# Patient Record
Sex: Male | Born: 1950 | Race: White | Hispanic: No | Marital: Married | Smoking: Former smoker
Health system: Southern US, Community
[De-identification: ages and names within clinical notes are randomized; demographics above are authoritative.]

## PROBLEM LIST (undated history)

## (undated) DIAGNOSIS — R519 Headache, unspecified: Secondary | ICD-10-CM

## (undated) DIAGNOSIS — M1990 Unspecified osteoarthritis, unspecified site: Secondary | ICD-10-CM

## (undated) DIAGNOSIS — K759 Inflammatory liver disease, unspecified: Secondary | ICD-10-CM

## (undated) DIAGNOSIS — Z87442 Personal history of urinary calculi: Secondary | ICD-10-CM

## (undated) DIAGNOSIS — G4730 Sleep apnea, unspecified: Secondary | ICD-10-CM

## (undated) DIAGNOSIS — C801 Malignant (primary) neoplasm, unspecified: Secondary | ICD-10-CM

## (undated) DIAGNOSIS — K2270 Barrett's esophagus without dysplasia: Secondary | ICD-10-CM

## (undated) DIAGNOSIS — J189 Pneumonia, unspecified organism: Secondary | ICD-10-CM

## (undated) DIAGNOSIS — I739 Peripheral vascular disease, unspecified: Secondary | ICD-10-CM

## (undated) DIAGNOSIS — E119 Type 2 diabetes mellitus without complications: Secondary | ICD-10-CM

## (undated) DIAGNOSIS — G709 Myoneural disorder, unspecified: Secondary | ICD-10-CM

## (undated) DIAGNOSIS — I499 Cardiac arrhythmia, unspecified: Secondary | ICD-10-CM

## (undated) DIAGNOSIS — I639 Cerebral infarction, unspecified: Secondary | ICD-10-CM

## (undated) DIAGNOSIS — F4310 Post-traumatic stress disorder, unspecified: Secondary | ICD-10-CM

---

## 2000-07-16 ENCOUNTER — Ambulatory Visit (HOSPITAL_COMMUNITY): Admission: RE | Admit: 2000-07-16 | Discharge: 2000-07-16 | Payer: Self-pay | Admitting: *Deleted

## 2016-01-16 ENCOUNTER — Institutional Professional Consult (permissible substitution): Payer: Self-pay | Admitting: Pulmonary Disease

## 2020-10-23 ENCOUNTER — Ambulatory Visit (INDEPENDENT_AMBULATORY_CARE_PROVIDER_SITE_OTHER): Payer: No Typology Code available for payment source | Admitting: Emergency Medicine

## 2020-10-23 ENCOUNTER — Other Ambulatory Visit: Payer: Self-pay

## 2020-10-23 ENCOUNTER — Encounter: Payer: Self-pay | Admitting: Emergency Medicine

## 2020-10-23 ENCOUNTER — Telehealth: Payer: Self-pay | Admitting: Emergency Medicine

## 2020-10-23 VITALS — BP 126/76 | HR 73 | Temp 99.1°F | Ht 71.0 in | Wt 184.2 lb

## 2020-10-23 DIAGNOSIS — C3491 Malignant neoplasm of unspecified part of right bronchus or lung: Secondary | ICD-10-CM | POA: Diagnosis not present

## 2020-10-23 NOTE — Patient Instructions (Signed)
We will work on arranging bronchoscopy with navigation and endobronchial ultrasound.  We will hopefully be able to do this on 11/05/2020 at [facility].  We will finalize the details and contact you. Dr. Lamonte Sakai will obtain a copy of your CT scan of the chest and lab work from the [place] to review If we proceed with your bronchoscopy then you will need to stop her Eliquis 2 days prior.

## 2020-10-23 NOTE — Assessment & Plan Note (Signed)
Stage IIIb disease, received chemoradiation, temporarily was on consolidation immunotherapy but did not tolerate due to pneumonitis.  Has an enlarging groundglass pulmonary nodule and an enlarging subcarinal lymph node on serial/surveillance CT chest.  Suspect he will require bronchoscopy with EBUS, possibly also navigation.  I will obtain copies of his CT and labs from the [place].  Tentatively plan to perform his procedure on 11/05/2020 or 11/11/2020.  He will have to stop his Eliquis 2 days prior.

## 2020-10-23 NOTE — Progress Notes (Signed)
Subjective:    Patient ID: Neil Riddle, male    DOB: 18-May-1951, 70 y.o.   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 He had A fib / Flutter after his initial SBRT.  Followed at the [facility] and oncology.  He has been undergoing serial CT scans, underwent chest abdomen pelvis 09/30/2020.  I have the records available, do not have the images yet.  This showed increased consolidative and groundglass right upper lobe and apical opacity compared with 06/26/2020, slight increase in size of a 7 mm right upper lobe groundglass nodule, interval increase in size of the right subcarinal paraesophageal lymph node.  No evidence of any other distant disease.  Occasional SOB w exertion, but quite active. Rare albuterol use.   Biopsy of suspicious hypermetabolic lymph node 04/2992 by repeat EBUS, nondiagnostic   Review of Systems As per HPI  PMH: COPD Diabetes Hypertension GERD Hyperlipidemia Depression Stage IIIb adenocarcinoma of the lung A Fib / Flutter OSA not on therapy  TIA  No family history on file.   Social History   Socioeconomic History  . Marital status: Married    Spouse name: Not on file  . Number of children: Not on file  . Years of education: Not on file  . Highest education level: Not on file  Occupational History  . Not on file  Tobacco Use  . Smoking status: Former Smoker    Packs/day: 0.50    Years: 35.00    Pack years: 17.50    Types: Cigarettes    Quit date: 1999    Years since quitting: 23.1  . Smokeless tobacco: Never Used  Substance and Sexual Activity  . Alcohol use: Not on file  . Drug use: Not on file  . Sexual activity: Not on file  Other Topics Concern  . Not on file  Social History  Narrative  . 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  No known asbestos exposure.  Some wood dust exposure.  No real water damage or mold exposure Has chickens.   Not on File   Outpatient Medications Prior to Visit  Medication Sig Dispense Refill  . acetaminophen (TYLENOL) 120 MG suppository Place rectally.    . Albuterol Sulfate, sensor, (PROAIR DIGIHALER) 108 (90 Base) MCG/ACT AEPB Inhale into the lungs.    Marland Kitchen apixaban (ELIQUIS) 5 MG TABS tablet Take by mouth.    Marland Kitchen aspirin 81 MG chewable tablet Chew by mouth.    Marland Kitchen buPROPion (WELLBUTRIN XL) 300 MG 24 hr tablet Take by mouth.    . Cholecalciferol 25 MCG (1000 UT) tablet Take by mouth.    . desloratadine (CLARINEX) 5 MG tablet Take by mouth.    . diltiazem (CARDIZEM CD) 360 MG 24 hr capsule Take by mouth.    . Flaxseed Oil (LINSEED OIL) OIL Take by mouth.    Marland Kitchen FLUoxetine (PROZAC) 20 MG capsule Take by  mouth.    . gabapentin (NEURONTIN) 300 MG capsule Take by mouth.    Marland Kitchen glipiZIDE (GLUCOTROL) 10 MG tablet Take by mouth.    . losartan (COZAAR) 100 MG tablet Take by mouth.    . methocarbamol (ROBAXIN) 500 MG tablet Take by mouth.    . montelukast (SINGULAIR) 10 MG tablet Take by mouth.    . Multiple Vitamin (MULTI-VITAMIN) tablet Take 1 tablet by mouth daily.    . pantoprazole (PROTONIX) 20 MG tablet Take by mouth.    . pravastatin (PRAVACHOL) 10 MG tablet Take by mouth.    Marland Kitchen rOPINIRole (REQUIP) 1 MG tablet Take by mouth.    . tamsulosin (FLOMAX) 0.4 MG CAPS capsule Take by mouth.     No facility-administered medications prior to visit.         Objective:    Physical Exam Vitals:   10/23/20 1500  BP: 126/76  Pulse: 73  Temp: 99.1 F (37.3 C)  SpO2: 96%  Weight: 184 lb 3.2 oz (83.6 kg)  Height: 5\' 11"  (1.803 m)   Gen: Pleasant, well-nourished, in no distress,  normal affect  ENT: No lesions,  mouth clear,  oropharynx clear, no postnasal drip  Neck: No JVD, no stridor  Lungs: No use of accessory muscles, no crackles or wheezing on normal respiration, no wheeze on forced expiration  Cardiovascular: RRR, heart sounds normal, no murmur or gallops, no peripheral edema  Musculoskeletal: No deformities, no cyanosis or clubbing  Neuro: alert, awake, non focal  Skin: Warm, no lesions or rash      Assessment & Plan:  Adenocarcinoma, lung, right (HCC) Stage IIIb disease, received chemoradiation, temporarily was on consolidation immunotherapy but did not tolerate due to pneumonitis.  Has an enlarging groundglass pulmonary nodule and an enlarging subcarinal lymph node on serial/surveillance CT chest.  Suspect he will require bronchoscopy with EBUS, possibly also navigation.  I will obtain copies of his CT and labs from the [place].  Tentatively plan to perform his procedure on 11/05/2020 or 11/11/2020.  He will have to stop his Eliquis 2 days prior.  Baltazar Apo, MD, PhD 10/23/2020, 3:45 PM [facility] [phone] or if no answer [phone]

## 2020-10-23 NOTE — H&P (View-Only) (Signed)
Subjective:    Patient ID: Neil Riddle, male    DOB: 04-03-1951, 70 y.o.   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 He had A fib / Flutter after his initial SBRT.  Followed at the [facility] and oncology.  He has been undergoing serial CT scans, underwent chest abdomen pelvis 09/30/2020.  I have the records available, do not have the images yet.  This showed increased consolidative and groundglass right upper lobe and apical opacity compared with 06/26/2020, slight increase in size of a 7 mm right upper lobe groundglass nodule, interval increase in size of the right subcarinal paraesophageal lymph node.  No evidence of any other distant disease.  Occasional SOB w exertion, but quite active. Rare albuterol use.   Biopsy of suspicious hypermetabolic lymph node 05/3902 by repeat EBUS, nondiagnostic   Review of Systems As per HPI  PMH: COPD Diabetes Hypertension GERD Hyperlipidemia Depression Stage IIIb adenocarcinoma of the lung A Fib / Flutter OSA not on therapy  TIA  No family history on file.   Social History   Socioeconomic History  . Marital status: Married    Spouse name: Not on file  . Number of children: Not on file  . Years of education: Not on file  . Highest education level: Not on file  Occupational History  . Not on file  Tobacco Use  . Smoking status: Former Smoker    Packs/day: 0.50    Years: 35.00    Pack years: 17.50    Types: Cigarettes    Quit date: 1999    Years since quitting: 23.1  . Smokeless tobacco: Never Used  Substance and Sexual Activity  . Alcohol use: Not on file  . Drug use: Not on file  . Sexual activity: Not on file  Other Topics Concern  . Not on file  Social History  Narrative  . 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  No known asbestos exposure.  Some wood dust exposure.  No real water damage or mold exposure Has chickens.   Not on File   Outpatient Medications Prior to Visit  Medication Sig Dispense Refill  . acetaminophen (TYLENOL) 120 MG suppository Place rectally.    . Albuterol Sulfate, sensor, (PROAIR DIGIHALER) 108 (90 Base) MCG/ACT AEPB Inhale into the lungs.    Marland Kitchen apixaban (ELIQUIS) 5 MG TABS tablet Take by mouth.    Marland Kitchen aspirin 81 MG chewable tablet Chew by mouth.    Marland Kitchen buPROPion (WELLBUTRIN XL) 300 MG 24 hr tablet Take by mouth.    . Cholecalciferol 25 MCG (1000 UT) tablet Take by mouth.    . desloratadine (CLARINEX) 5 MG tablet Take by mouth.    . diltiazem (CARDIZEM CD) 360 MG 24 hr capsule Take by mouth.    . Flaxseed Oil (LINSEED OIL) OIL Take by mouth.    Marland Kitchen FLUoxetine (PROZAC) 20 MG capsule Take by  mouth.    . gabapentin (NEURONTIN) 300 MG capsule Take by mouth.    Marland Kitchen glipiZIDE (GLUCOTROL) 10 MG tablet Take by mouth.    . losartan (COZAAR) 100 MG tablet Take by mouth.    . methocarbamol (ROBAXIN) 500 MG tablet Take by mouth.    . montelukast (SINGULAIR) 10 MG tablet Take by mouth.    . Multiple Vitamin (MULTI-VITAMIN) tablet Take 1 tablet by mouth daily.    . pantoprazole (PROTONIX) 20 MG tablet Take by mouth.    . pravastatin (PRAVACHOL) 10 MG tablet Take by mouth.    Marland Kitchen rOPINIRole (REQUIP) 1 MG tablet Take by mouth.    . tamsulosin (FLOMAX) 0.4 MG CAPS capsule Take by mouth.     No facility-administered medications prior to visit.         Objective:    Physical Exam Vitals:   10/23/20 1500  BP: 126/76  Pulse: 73  Temp: 99.1 F (37.3 C)  SpO2: 96%  Weight: 184 lb 3.2 oz (83.6 kg)  Height: 5\' 11"  (1.803 m)   Gen: Pleasant, well-nourished, in no distress,  normal affect  ENT: No lesions,  mouth clear,  oropharynx clear, no postnasal drip  Neck: No JVD, no stridor  Lungs: No use of accessory muscles, no crackles or wheezing on normal respiration, no wheeze on forced expiration  Cardiovascular: RRR, heart sounds normal, no murmur or gallops, no peripheral edema  Musculoskeletal: No deformities, no cyanosis or clubbing  Neuro: alert, awake, non focal  Skin: Warm, no lesions or rash      Assessment & Plan:  Adenocarcinoma, lung, right (HCC) Stage IIIb disease, received chemoradiation, temporarily was on consolidation immunotherapy but did not tolerate due to pneumonitis.  Has an enlarging groundglass pulmonary nodule and an enlarging subcarinal lymph node on serial/surveillance CT chest.  Suspect he will require bronchoscopy with EBUS, possibly also navigation.  I will obtain copies of his CT and labs from the [place].  Tentatively plan to perform his procedure on 11/05/2020 or 11/11/2020.  He will have to stop his Eliquis 2 days prior.  Baltazar Apo, MD, PhD 10/23/2020, 3:45 PM [facility] [phone] or if no answer [phone]

## 2020-10-29 NOTE — Telephone Encounter (Signed)
Nothing noted in message. Will close encounter.  

## 2020-10-30 ENCOUNTER — Telehealth: Payer: Self-pay | Admitting: Emergency Medicine

## 2020-10-30 NOTE — Telephone Encounter (Signed)
OV note from 10/23/2020 has been faxed over to [place], [place] at the [place] in [place].

## 2020-11-02 ENCOUNTER — Other Ambulatory Visit (HOSPITAL_COMMUNITY)
Admission: RE | Admit: 2020-11-02 | Discharge: 2020-11-02 | Disposition: A | Discharge status: Home / Self Care | Payer: No Typology Code available for payment source | Source: Ambulatory Visit | Attending: Emergency Medicine | Admitting: Emergency Medicine

## 2020-11-02 DIAGNOSIS — Z20822 Contact with and (suspected) exposure to covid-19: Secondary | ICD-10-CM | POA: Diagnosis not present

## 2020-11-02 DIAGNOSIS — Z01812 Encounter for preprocedural laboratory examination: Secondary | ICD-10-CM | POA: Diagnosis present

## 2020-11-02 LAB — SARS CORONAVIRUS 2 (TAT 6-24 HRS): SARS Coronavirus 2: NEGATIVE

## 2020-11-04 ENCOUNTER — Encounter (HOSPITAL_COMMUNITY): Payer: Self-pay | Admitting: Emergency Medicine

## 2020-11-04 NOTE — Anesthesia Preprocedure Evaluation (Addendum)
Anesthesia Evaluation  Patient identified by MRN, date of birth, ID band Patient awake    Reviewed: Allergy & Precautions, NPO status , Patient's Chart, lab work & pertinent test results  Airway Mallampati: II  TM Distance: >3 FB Neck ROM: Full    Dental   Crowns/implants:   Pulmonary sleep apnea , former smoker,    breath sounds clear to auscultation       Cardiovascular Exercise Tolerance: Good + Peripheral Vascular Disease  Normal cardiovascular exam+ dysrhythmias Atrial Fibrillation  Rhythm:Regular Rate:Normal  05-Nov-2020 07:43:44 [facility] ROUTINE RECORD Normal sinus rhythm Left axis deviation Abnormal ECG   Neuro/Psych  Headaches, PSYCHIATRIC DISORDERS Anxiety  Neuromuscular disease CVA    GI/Hepatic negative GI ROS, (+) Hepatitis - (treated), C  Endo/Other  diabetes, Type 2, Oral Hypoglycemic Agents  Renal/GU negative Renal ROS     Musculoskeletal  (+) Arthritis ,   Abdominal Normal abdominal exam  (+)   Peds  Hematology negative hematology ROS (+)   Anesthesia Other Findings   Reproductive/Obstetrics                          Anesthesia Physical Anesthesia Plan  ASA: III  Anesthesia Plan: General   Post-op Pain Management:    Induction: Intravenous  PONV Risk Score and Plan: 2 and Ondansetron, Dexamethasone and Midazolam  Airway Management Planned: Oral ETT  Additional Equipment:   Intra-op Plan:   Post-operative Plan: Extubation in OR  Informed Consent: I have reviewed the patients History and Physical, chart, labs and discussed the procedure including the risks, benefits and alternatives for the proposed anesthesia with the patient or authorized representative who has indicated his/her understanding and acceptance.     Dental advisory given  Plan Discussed with: CRNA and Anesthesiologist  Anesthesia Plan Comments: (PAT note by Karoline Caldwell,  PA-C: History of adenocarcinoma of the right lung, followed by oncology at the [facility].  He received chemoradiation, temporarily was on consolidation immunotherapy but did not tolerate due to pneumonitis.    Per Dr. Agustina Caroli notes, patient has an enlarging groundglass pulmonary nodule and an enlarging subcarinal lymph node on serial/surveillance CT chest.  He developed A. fib/flutter after his initial SBRT.  He was seen by cardiology at [facility].  He had concomitant placement of loop recorder and CTI ablation on 12/18/2019, which was successful.  He was last seen by Dr. Jonette Eva 03/11/2020.  Per note, there was no evidence of recurrence, his sotalol was stopped, he was advised to continue Eliquis 5 mg twice daily.  Patient was noted to be feeling better s/p ablation, improved exercise tolerance.  No evidence of atrial fibrillation on loop recorder.  At that time he was advised to follow-up with providers at the [facility], will be seen by cardiology at [facility] as needed.  Last remote check of patient's loop recorder on 04/24/2020 showed alert for first tachy event and device history, 1:1 SVT at 167 bpm on 04/16/2020 for 39 seconds.  Recommendation was to continue monitoring.  Per Dr. Agustina Caroli note, pt to stop Eliquis 2d prior to procedure.   DMII, not on insulin.   Patient will need day of surgery labs and evaluation.  EKG 03/11/2020 (Care Everywhere, narrative only, tracing requested): Sinus rhythm with first-degree AV block.  Rate 70.  TTE 12/04/2019 (Care Everywhere): SUMMARY  The left ventricular size is normal.  Mild left ventricular hypertrophy  Left ventricular systolic function is normal.  LV ejection fraction = 60-65%.  Left ventricular filling pattern is prolonged relaxation.  The right ventricle is normal in size and function.  There is aortic valve sclerosis.  There is no significant valvular stenosis or regurgitation.  The aortic sinus is mildly dilated.  Borderline dilated ascending aorta.  The  IVC is normal in size with an inspiratory collapse of greater then  50%, suggesting normal right atrial pressure.  There is no pericardial effusion.  There is no comparison study available.    )       Anesthesia Quick Evaluation

## 2020-11-04 NOTE — Progress Notes (Signed)
Anesthesia Chart Review: Same day workup  History of adenocarcinoma of the right lung, followed by oncology at the [facility].  He received chemoradiation, temporarily was on consolidation immunotherapy but did not tolerate due to pneumonitis.    Per Dr. Agustina Caroli notes, patient has an enlarging groundglass pulmonary nodule and an enlarging subcarinal lymph node on serial/surveillance CT chest.  He developed A. fib/flutter after his initial SBRT.  He was seen by cardiology at [facility].  He had concomitant placement of loop recorder and CTI ablation on 12/18/2019, which was successful.  He was last seen by Dr. Jonette Eva 03/11/2020.  Per note, there was no evidence of recurrence, his sotalol was stopped, he was advised to continue Eliquis 5 mg twice daily.  Patient was noted to be feeling better s/p ablation, improved exercise tolerance.  No evidence of atrial fibrillation on loop recorder.  At that time he was advised to follow-up with providers at the [facility], will be seen by cardiology at [facility] as needed.  Last remote check of patient's loop recorder on 04/24/2020 showed alert for first tachy event and device history, 1:1 SVT at 167 bpm on 04/16/2020 for 39 seconds.  Recommendation was to continue monitoring.  Per Dr. Agustina Caroli note, pt to stop Eliquis 2d prior to procedure.   DMII, not on insulin.   Patient will need day of surgery labs and evaluation.  EKG 03/11/2020 (Care Everywhere, narrative only, tracing requested): Sinus rhythm with first-degree AV block.  Rate 70.  TTE 12/04/2019 (Care Everywhere): SUMMARY  The left ventricular size is normal.  Mild left ventricular hypertrophy  Left ventricular systolic function is normal.  LV ejection fraction = 60-65%.  Left ventricular filling pattern is prolonged relaxation.  The right ventricle is normal in size and function.  There is aortic valve sclerosis.  There is no significant valvular stenosis or regurgitation.  The aortic sinus is mildly dilated.   Borderline dilated ascending aorta.  The IVC is normal in size with an inspiratory collapse of greater then  50%, suggesting normal right atrial pressure.  There is no pericardial effusion.  There is no comparison study available.     Wynonia Musty [facility] Short Stay Center/Anesthesiology Phone [phone] 11/04/2020 3:53 PM

## 2020-11-04 NOTE — Progress Notes (Signed)
Spoke with pt and his wife, Denice Paradise for pre-op call. Pt has hx of A-flutter, has had an ablation which was successful. Denice Paradise states pt is so much better after the ablation was done. Pt also has a loop recorder. Pt is a type 2 diabetic. States his fasting blood sugar is usually 140 or less. Denice Paradise states pt's most recent A1C was on 6.7 about 2 weeks ago. Instructed pt and wife not to take his Glipizide this evening or in the AM. Instructed pt to check his blood sugar when he gets up in the AM. If blood sugar is 70 or below, treat with 1/2 cup of clear juice (apple or cranberry) and recheck blood sugar 15 minutes after drinking juice. If blood sugar continues to be 70 or below, call the Short Stay department and ask to speak to a nurse. Both patient and wife voiced understanding.   Covid test done 11/02/20.  Pt states he's been in quarantine since the test was done and understands that he stays in quarantine until he comes to the hospital tomorrow.

## 2020-11-05 ENCOUNTER — Ambulatory Visit (HOSPITAL_COMMUNITY): Payer: No Typology Code available for payment source | Admitting: Physician Assistant

## 2020-11-05 ENCOUNTER — Other Ambulatory Visit: Payer: Self-pay

## 2020-11-05 ENCOUNTER — Encounter (HOSPITAL_COMMUNITY)
Admission: RE | Disposition: A | Discharge status: Home / Self Care | Payer: Self-pay | Source: Home / Self Care | Attending: Emergency Medicine

## 2020-11-05 ENCOUNTER — Encounter (HOSPITAL_COMMUNITY): Payer: Self-pay | Admitting: Emergency Medicine

## 2020-11-05 ENCOUNTER — Ambulatory Visit (HOSPITAL_COMMUNITY): Payer: No Typology Code available for payment source

## 2020-11-05 ENCOUNTER — Ambulatory Visit (HOSPITAL_COMMUNITY)
Admission: RE | Admit: 2020-11-05 | Discharge: 2020-11-05 | Disposition: A | Discharge status: Home / Self Care | Payer: No Typology Code available for payment source | Attending: Emergency Medicine | Admitting: Emergency Medicine

## 2020-11-05 DIAGNOSIS — R590 Localized enlarged lymph nodes: Secondary | ICD-10-CM | POA: Diagnosis present

## 2020-11-05 DIAGNOSIS — E785 Hyperlipidemia, unspecified: Secondary | ICD-10-CM | POA: Insufficient documentation

## 2020-11-05 DIAGNOSIS — Z9221 Personal history of antineoplastic chemotherapy: Secondary | ICD-10-CM | POA: Insufficient documentation

## 2020-11-05 DIAGNOSIS — K219 Gastro-esophageal reflux disease without esophagitis: Secondary | ICD-10-CM | POA: Insufficient documentation

## 2020-11-05 DIAGNOSIS — Z87891 Personal history of nicotine dependence: Secondary | ICD-10-CM | POA: Diagnosis not present

## 2020-11-05 DIAGNOSIS — I10 Essential (primary) hypertension: Secondary | ICD-10-CM | POA: Diagnosis not present

## 2020-11-05 DIAGNOSIS — Z7901 Long term (current) use of anticoagulants: Secondary | ICD-10-CM | POA: Insufficient documentation

## 2020-11-05 DIAGNOSIS — J449 Chronic obstructive pulmonary disease, unspecified: Secondary | ICD-10-CM | POA: Insufficient documentation

## 2020-11-05 DIAGNOSIS — Z7984 Long term (current) use of oral hypoglycemic drugs: Secondary | ICD-10-CM | POA: Diagnosis not present

## 2020-11-05 DIAGNOSIS — F32A Depression, unspecified: Secondary | ICD-10-CM | POA: Insufficient documentation

## 2020-11-05 DIAGNOSIS — E119 Type 2 diabetes mellitus without complications: Secondary | ICD-10-CM | POA: Diagnosis not present

## 2020-11-05 DIAGNOSIS — C3491 Malignant neoplasm of unspecified part of right bronchus or lung: Secondary | ICD-10-CM | POA: Diagnosis not present

## 2020-11-05 DIAGNOSIS — Z79899 Other long term (current) drug therapy: Secondary | ICD-10-CM | POA: Insufficient documentation

## 2020-11-05 DIAGNOSIS — R911 Solitary pulmonary nodule: Secondary | ICD-10-CM | POA: Insufficient documentation

## 2020-11-05 DIAGNOSIS — Z85118 Personal history of other malignant neoplasm of bronchus and lung: Secondary | ICD-10-CM | POA: Insufficient documentation

## 2020-11-05 DIAGNOSIS — Z9889 Other specified postprocedural states: Secondary | ICD-10-CM

## 2020-11-05 DIAGNOSIS — Z923 Personal history of irradiation: Secondary | ICD-10-CM | POA: Insufficient documentation

## 2020-11-05 DIAGNOSIS — R918 Other nonspecific abnormal finding of lung field: Secondary | ICD-10-CM | POA: Diagnosis not present

## 2020-11-05 HISTORY — DX: Barrett's esophagus without dysplasia: K22.70

## 2020-11-05 HISTORY — DX: Type 2 diabetes mellitus without complications: E11.9

## 2020-11-05 HISTORY — PX: BRONCHIAL BIOPSY: SHX5109

## 2020-11-05 HISTORY — DX: Malignant (primary) neoplasm, unspecified: C80.1

## 2020-11-05 HISTORY — DX: Pneumonia, unspecified organism: J18.9

## 2020-11-05 HISTORY — DX: Post-traumatic stress disorder, unspecified: F43.10

## 2020-11-05 HISTORY — PX: BRONCHIAL NEEDLE ASPIRATION BIOPSY: SHX5106

## 2020-11-05 HISTORY — DX: Cardiac arrhythmia, unspecified: I49.9

## 2020-11-05 HISTORY — PX: VIDEO BRONCHOSCOPY WITH ENDOBRONCHIAL ULTRASOUND: SHX6177

## 2020-11-05 HISTORY — PX: FINE NEEDLE ASPIRATION: SHX5430

## 2020-11-05 HISTORY — DX: Headache, unspecified: R51.9

## 2020-11-05 HISTORY — DX: Myoneural disorder, unspecified: G70.9

## 2020-11-05 HISTORY — DX: Personal history of urinary calculi: Z87.442

## 2020-11-05 HISTORY — DX: Inflammatory liver disease, unspecified: K75.9

## 2020-11-05 HISTORY — DX: Unspecified osteoarthritis, unspecified site: M19.90

## 2020-11-05 HISTORY — DX: Peripheral vascular disease, unspecified: I73.9

## 2020-11-05 HISTORY — PX: BRONCHIAL BRUSHINGS: SHX5108

## 2020-11-05 HISTORY — PX: BRONCHIAL WASHINGS: SHX5105

## 2020-11-05 HISTORY — PX: VIDEO BRONCHOSCOPY WITH ENDOBRONCHIAL NAVIGATION: SHX6175

## 2020-11-05 HISTORY — DX: Cerebral infarction, unspecified: I63.9

## 2020-11-05 HISTORY — DX: Sleep apnea, unspecified: G47.30

## 2020-11-05 LAB — COMPREHENSIVE METABOLIC PANEL
ALT: 20 U/L (ref 0–44)
AST: 22 U/L (ref 15–41)
Albumin: 3.8 g/dL (ref 3.5–5.0)
Alkaline Phosphatase: 70 U/L (ref 38–126)
Anion gap: 11 (ref 5–15)
BUN: 16 mg/dL (ref 8–23)
CO2: 24 mmol/L (ref 22–32)
Calcium: 8.7 mg/dL — ABNORMAL LOW (ref 8.9–10.3)
Chloride: 107 mmol/L (ref 98–111)
Creatinine, Ser: 0.8 mg/dL (ref 0.61–1.24)
GFR, Estimated: >60 mL/min (ref >60)
Glucose, Bld: 132 mg/dL — ABNORMAL HIGH (ref 70–99)
Potassium: 3.9 mmol/L (ref 3.5–5.1)
Sodium: 142 mmol/L (ref 135–145)
Total Bilirubin: 0.8 mg/dL (ref 0.3–1.2)
Total Protein: 6.2 g/dL — ABNORMAL LOW (ref 6.5–8.1)

## 2020-11-05 LAB — GLUCOSE, CAPILLARY: Glucose-Capillary: 136 mg/dL — ABNORMAL HIGH (ref 70–99)

## 2020-11-05 SURGERY — VIDEO BRONCHOSCOPY WITH ENDOBRONCHIAL NAVIGATION
Anesthesia: General | Laterality: Bilateral

## 2020-11-05 MED ORDER — DEXAMETHASONE SODIUM PHOSPHATE 10 MG/ML IJ SOLN
INTRAMUSCULAR | Status: DC | PRN
  Administered 2020-11-05: 5 mg via INTRAVENOUS

## 2020-11-05 MED ORDER — ONDANSETRON HCL 4 MG/2ML IJ SOLN: 4.0000 mg | Freq: Once | INTRAMUSCULAR | Status: DC | PRN

## 2020-11-05 MED ORDER — FENTANYL CITRATE (PF) 100 MCG/2ML IJ SOLN: 25.0000 ug | INTRAMUSCULAR | Status: DC | PRN

## 2020-11-05 MED ORDER — ONDANSETRON HCL 4 MG/2ML IJ SOLN
INTRAMUSCULAR | Status: DC | PRN
  Administered 2020-11-05: 4 mg via INTRAVENOUS

## 2020-11-05 MED ORDER — ROCURONIUM BROMIDE 10 MG/ML (PF) SYRINGE
PREFILLED_SYRINGE | INTRAVENOUS | Status: DC | PRN
  Administered 2020-11-05: 10 mg via INTRAVENOUS
  Administered 2020-11-05: 60 mg via INTRAVENOUS

## 2020-11-05 MED ORDER — PHENYLEPHRINE HCL (PRESSORS) 10 MG/ML IV SOLN
INTRAVENOUS | Status: DC | PRN
  Administered 2020-11-05: 160 ug via INTRAVENOUS
  Administered 2020-11-05 (×2): 120 ug via INTRAVENOUS

## 2020-11-05 MED ORDER — LIDOCAINE 2% (20 MG/ML) 5 ML SYRINGE
INTRAMUSCULAR | Status: DC | PRN
  Administered 2020-11-05: 80 mg via INTRAVENOUS

## 2020-11-05 MED ORDER — OXYCODONE HCL 5 MG/5ML PO SOLN: 5.0000 mg | Freq: Once | ORAL | Status: DC | PRN

## 2020-11-05 MED ORDER — PROPOFOL 10 MG/ML IV BOLUS
INTRAVENOUS | Status: DC | PRN
  Administered 2020-11-05: 120 mg via INTRAVENOUS

## 2020-11-05 MED ORDER — PHENYLEPHRINE HCL-NACL 10-0.9 MG/250ML-% IV SOLN
INTRAVENOUS | Status: DC | PRN
  Administered 2020-11-05: 50 ug/min via INTRAVENOUS

## 2020-11-05 MED ORDER — LACTATED RINGERS IV SOLN: INTRAVENOUS | Status: DC

## 2020-11-05 MED ORDER — AMISULPRIDE (ANTIEMETIC) 5 MG/2ML IV SOLN: 10.0000 mg | Freq: Once | INTRAVENOUS | Status: DC | PRN

## 2020-11-05 MED ORDER — EPHEDRINE SULFATE-NACL 50-0.9 MG/10ML-% IV SOSY
PREFILLED_SYRINGE | INTRAVENOUS | Status: DC | PRN
  Administered 2020-11-05: 10 mg via INTRAVENOUS
  Administered 2020-11-05: 15 mg via INTRAVENOUS

## 2020-11-05 MED ORDER — APIXABAN 5 MG PO TABS
5.0000 mg | ORAL_TABLET | Freq: Two times a day (BID) | ORAL | Status: AC
Start: 2020-11-05 — End: ?

## 2020-11-05 MED ORDER — SUGAMMADEX SODIUM 200 MG/2ML IV SOLN
INTRAVENOUS | Status: DC | PRN
  Administered 2020-11-05: 200 mg via INTRAVENOUS

## 2020-11-05 MED ORDER — FENTANYL CITRATE (PF) 100 MCG/2ML IJ SOLN
INTRAMUSCULAR | Status: DC | PRN
  Administered 2020-11-05: 100 ug via INTRAVENOUS

## 2020-11-05 MED ORDER — OXYCODONE HCL 5 MG PO TABS: 5.0000 mg | ORAL_TABLET | Freq: Once | ORAL | Status: DC | PRN

## 2020-11-05 NOTE — Anesthesia Procedure Notes (Addendum)
Procedure Name: Intubation Date/Time: 11/05/2020 9:01 AM Performed by: Janace Litten, CRNA Pre-anesthesia Checklist: Patient identified, Emergency Drugs available, Suction available and Patient being monitored Patient Re-evaluated:Patient Re-evaluated prior to induction Oxygen Delivery Method: Circle System Utilized Preoxygenation: Pre-oxygenation with 100% oxygen Induction Type: IV induction Ventilation: Mask ventilation without difficulty Laryngoscope Size: Mac and 4 Grade View: Grade I Tube type: Oral Tube size: 8.5 mm Number of attempts: 1 Airway Equipment and Method: Stylet and Oral airway Placement Confirmation: ETT inserted through vocal cords under direct vision,  positive ETCO2 and breath sounds checked- equal and bilateral Secured at: 23 cm Tube secured with: Tape Dental Injury: Teeth and Oropharynx as per pre-operative assessment

## 2020-11-05 NOTE — Transfer of Care (Signed)
Immediate Anesthesia Transfer of Care Note  Patient: Neil Riddle  Procedure(s) Performed: VIDEO BRONCHOSCOPY WITH ENDOBRONCHIAL NAVIGATION (Bilateral ) VIDEO BRONCHOSCOPY WITH ENDOBRONCHIAL ULTRASOUND (Bilateral ) BRONCHIAL BIOPSIES BRONCHIAL BRUSHINGS BRONCHIAL NEEDLE ASPIRATION BIOPSIES BRONCHIAL WASHINGS FINE NEEDLE ASPIRATION (FNA) LINEAR  Patient Location: PACU and Endoscopy Unit  Anesthesia Type:General  Level of Consciousness: awake, alert  and patient cooperative  Airway & Oxygen Therapy: Patient Spontanous Breathing  Post-op Assessment: Report given to RN and Post -op Vital signs reviewed and stable  Post vital signs: Reviewed and stable  Last Vitals:  Vitals Value Taken Time  BP    Temp    Pulse 81 11/05/20 1025  Resp 17 11/05/20 1025  SpO2 98 % 11/05/20 1025  Vitals shown include unvalidated device data.  Last Pain:  Vitals:   11/05/20 0738  TempSrc:   PainSc: 3       Patients Stated Pain Goal: 7 (91/79/15 0569)  Complications: No complications documented.

## 2020-11-05 NOTE — Anesthesia Postprocedure Evaluation (Signed)
Anesthesia Post Note  Patient: Drue Dun  Procedure(s) Performed: VIDEO BRONCHOSCOPY WITH ENDOBRONCHIAL NAVIGATION (Bilateral ) VIDEO BRONCHOSCOPY WITH ENDOBRONCHIAL ULTRASOUND (Bilateral ) BRONCHIAL BIOPSIES BRONCHIAL BRUSHINGS BRONCHIAL NEEDLE ASPIRATION BIOPSIES BRONCHIAL WASHINGS FINE NEEDLE ASPIRATION (FNA) LINEAR     Patient location during evaluation: Endoscopy Anesthesia Type: General Level of consciousness: awake and alert Pain management: pain level controlled Vital Signs Assessment: post-procedure vital signs reviewed and stable Respiratory status: spontaneous breathing, nonlabored ventilation and respiratory function stable Cardiovascular status: blood pressure returned to baseline and stable Postop Assessment: no apparent nausea or vomiting Anesthetic complications: no   No complications documented.  Last Vitals:  Vitals:   11/05/20 1050 11/05/20 1100  BP: 138/75 127/63  Pulse: 80 79  Resp: 13 19  Temp:    SpO2: 97% 97%    Last Pain:  Vitals:   11/05/20 1035  TempSrc:   PainSc: 0-No pain                 Merlinda Frederick

## 2020-11-05 NOTE — Interval H&P Note (Signed)
History and Physical Interval Note:  11/05/2020 7:24 AM  Drue Dun  has presented today for surgery, with the diagnosis of MEDIASTINAL ADENOPATHY, PULMONARY NODULES.  The various methods of treatment have been discussed with the patient and family. After consideration of risks, benefits and other options for treatment, the patient has consented to  Procedure(s): [place] (Bilateral) VIDEO BRONCHOSCOPY WITH ENDOBRONCHIAL ULTRASOUND (Bilateral) as a surgical intervention.  The patient's history has been reviewed, patient examined, no change in status, stable for surgery.  I have reviewed the patient's chart and labs.  Questions were answered to the patient's satisfaction.     Collene Gobble

## 2020-11-05 NOTE — Op Note (Signed)
Video Bronchoscopy with Endobronchial Ultrasound and Electromagnetic Navigation Procedure Note  Date of Operation: 11/05/2020  Pre-op Diagnosis: Subcarinal adenopathy, right upper lobe groundglass nodule  Post-op Diagnosis: Same  Surgeon: Baltazar Apo  Assistants: None  Anesthesia: General endotracheal anesthesia  Operation: Flexible video fiberoptic bronchoscopy with endobronchial ultrasound, electromagnetic navigation and biopsies.  Estimated Blood Loss: Minimal  Complications: None apparent  Indications and History: Neil Riddle is a 70 y.o. 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  Surveillance imaging revealed an enlarging subcarinal/paraesophageal lymph node concerning for possible recurrence.  Also noted was a slight increase in size of a 7 mm right upper lobe groundglass nodule adjacent to his area of treatment.  Recommendation was made to evaluate further with electromagnetic navigation bronchoscopy, endobronchial ultrasound.  The risks, benefits, complications, treatment options and expected outcomes were discussed with the patient.  The possibilities of pneumothorax, pneumonia, reaction to medication, pulmonary aspiration, perforation of a viscus, bleeding, failure to diagnose a condition and creating a complication requiring transfusion or operation were discussed with the patient who freely signed the consent.    Description of Procedure: The patient was examined in the preoperative area and history and data from the preprocedure consultation were reviewed. It was deemed appropriate to proceed.  The patient was taken to [facility] endoscopy room 2, identified as Drue Dun and the procedure verified as Flexible Video Fiberoptic Bronchoscopy.  A Time Out was held and the above information confirmed. After being taken to the operating room general anesthesia was initiated and the patient  was orally intubated. The  video fiberoptic bronchoscope was introduced via the endotracheal tube and a general inspection was performed which showed normal airways on the left.  The apical segment of the right upper lobe was scarred and no true orifice was present.  Instruments could not be advanced into the apical segment.  Endobronchial needle biopsies were obtained in the apical segment of the right upper lobe.  This corresponded with the region of scarring/opacity defined as target 2 on the patient's navigational bronchoscopy.  This was sent for cytology.  The standard scope was then withdrawn and the endobronchial ultrasound was used to identify and characterize the peritracheal, hilar and bronchial lymph nodes. Inspection showed enlargement of the lymph node at station 7. Using real-time ultrasound guidance Wang needle biopsies were take from Station 7 node and were sent for cytology.   Attention was then turned to the patient's peripheral lesion. Prior to the date of the procedure a high-resolution CT scan of the chest was performed. Utilizing [place] a virtual tracheobronchial tree was generated to allow the creation of distinct navigation pathways to the patient's parenchymal abnormalities.  Target 1 was identified as a groundglass nodule adjacent to area of right upper lobe scarring.  Target 2 was identified as the area of right upper lobe scarring itself.  The video fiberoptic bronchoscope was re-introduced via the endotracheal tube. An extendable working channel and locator guide were introduced into the bronchoscope. The distinct navigation pathways prepared prior to this procedure were then utilized to navigate to within 0.4 cm of right upper lobe groundglass nodule, target 1 identified on CT scan. The extendable working channel was secured into place and the locator guide was withdrawn. Under fluoroscopic guidance transbronchial brushings, transbronchial Wang needle biopsies, and transbronchial forceps  biopsies were performed to be sent for cytology and pathology. A bronchioalveolar lavage was performed in the right upper lobe and sent for  cytology. At the end of the procedure a general airway inspection was performed and there was no evidence of active bleeding. The bronchoscope was removed.  The patient tolerated the procedure well. There was no significant blood loss and there were no obvious complications. A post-procedural chest x-ray is pending.   Samples: 1. Wang needle biopsies from 7 node 2. Transbronchial brushings from right upper lobe groundglass nodule, target 1 2. Transbronchial Wang needle biopsies from right upper lobe groundglass nodule, target 1 3. Transbronchial forceps biopsies from right upper lobe groundglass nodule, target 1 4. Bronchoalveolar lavage from right upper lobe 5. Endobronchial Wang needle biopsies from apical segment of the right upper lobe (scarred/occluded)  Plans:  The patient will be discharged from the PACU to home when recovered from anesthesia. We will review the cytology, pathology and microbiology results with the patient when they become available. Outpatient followup will be with Dr. Lamonte Sakai.    Baltazar Apo, MD, PhD 11/05/2020, 10:26 AM [facility] [phone] or if no answer before 7:00PM call [phone] For any issues after 7:00PM please call eLink [phone]

## 2020-11-05 NOTE — Discharge Instructions (Signed)
Flexible Bronchoscopy  Flexible bronchoscopy is a procedure used to examine the passageways in the lungs. During the procedure, a thin, flexible tool with a camera (bronchoscope) is passed into the mouth or nose, down through the windpipe (trachea), and into the air tubes in the lungs (bronchi). This tool allows the health care provider to look inside the lungs and to take samples for testing, if needed. Tell a health care provider about:  Any allergies you have.  All medicines you are taking, including vitamins, herbs, eye drops, creams, and over-the-counter medicines.  Any problems you or family members have had with anesthetic medicines.  Any blood disorders you have.  Any surgeries you have had.  Any medical conditions you have.  Whether you are pregnant or may be pregnant. What are the risks? Generally, this is a safe procedure. However, problems may occur, including:  Infection.  Bleeding.  Damage to other structures or organs.  Allergic reactions to medicines.  Collapsed lung (pneumothorax).  Increased need for oxygen or difficulty breathing after the procedure. What happens before the procedure? Staying hydrated Follow instructions from your health care provider about hydration, which may include:  Up to 2 hours before the procedure - you may continue to drink clear liquids, such as water, clear fruit juice, black coffee, and plain tea.   Eating and drinking restrictions Follow instructions from your health care provider about eating and drinking, which may include:  8 hours before the procedure - stop eating heavy meals or foods, such as meat, fried foods, or fatty foods.  6 hours before the procedure - stop eating light meals or foods, such as toast or cereal.  6 hours before the procedure - stop drinking milk or drinks that contain milk.  2 hours before the procedure - stop drinking clear liquids. Medicines Ask your health care provider about:  Changing or  stopping your regular medicines. This is especially important if you are taking diabetes medicines or blood thinners.  Taking medicines such as aspirin and ibuprofen. These medicines can thin your blood. Do not take these medicines unless your health care provider tells you to take them.  Taking over-the-counter medicines, vitamins, herbs, and supplements. General instructions  You may be given antibiotic medicine to help lower the risk of infection.  Plan to have a responsible adult take you home from the hospital or clinic.  If you will be going home right after the procedure, plan to have a responsible adult care for you for the time you are told. This is important. What happens during the procedure?  An IV will be inserted into one of your veins.  You will be given a medicine (local anesthetic) to numb your mouth, nose, throat, and voice box (larynx). You may also be given one or more of the following: ? A medicine to help you relax (sedative). ? A medicine to control coughing. ? A medicine to dry up any fluids or secretions in your lungs.  A bronchoscope will be passed into your nose or mouth, and into your lungs. Your health care provider will examine your lungs.  Samples of airway secretions may be collected for testing.  If abnormal areas are seen in your airways, samples of tissue may be removed and checked under a microscope (biopsy).  If tissue samples are needed from the outer parts of the lung, a type of X-ray (fluoroscopy) may be used to guide the bronchoscope to these areas.  If bleeding occurs, you may be given medicine to  stop or decrease the bleeding. The procedure may vary among health care providers and hospitals. What can I expect after the procedure?  Your blood pressure, heart rate, breathing rate, and blood oxygen level will be monitored until you leave the hospital or clinic.  You may have a chest X-ray to check for signs of pneumothorax.  You willnot be  allowed to eat or drink anything for 2 hours after your procedure.  If a biopsy was taken, it is up to you to get the results of the test. Ask your health care provider, or the department that is doing the procedure, when your results will be ready.  You may have the following symptoms for 24-48 hours: ? A cough that is worse than it was before the procedure. ? A low-grade fever. ? A sore throat or hoarse voice. ? Some blood in the mucus from your lungs (sputum), if a biopsy was done. Follow these instructions at home: Eating and drinking  Do not eat or drink anything, including water, for 2 hours after your procedure, or until your numbing medicine has worn off. Having a numb throat increases your risk of burning yourself or choking.  Start eating soft foods and slowly drinking liquids after your numbness is gone and your cough and gag reflexes have returned.  You may return to your normal diet the day after the procedure. Driving  If you were given a sedative during the procedure, it can affect you for several hours. Do not drive or operate machinery until your health care provider says that it is safe.  Ask your health care provider if the medicine prescribed to you requires you to avoid driving or using machinery.  Return to your normal activities as told by your health care provider. Ask your health care provider what activities are safe for you. General instructions  Take over-the-counter and prescription medicines only as told by your health care provider.  Do not use any products that contain nicotine or tobacco. These products include cigarettes, chewing tobacco, and vaping devices, such as e-cigarettes. If you need help quitting, ask your health care provider.  Keep all follow-up visits. This is important.   Get help right away if:  You have shortness of breath that gets worse.  You become light-headed or feel like you might faint.  You have chest pain.  You cough up  more than a small amount of blood. These symptoms may represent a serious problem that is an emergency. Do not wait to see if the symptoms will go away. Get medical help right away. Call your local emergency services (911 in the [place] Do not drive yourself to the hospital. Summary  Flexible bronchoscopy is a procedure that allows your health care provider to look closely inside your lungs and to take testing samples if needed.  Risks of flexible bronchoscopy include bleeding, infection, and collapsed lung (pneumothorax).  Before the procedure, you will be given a medicine to numb your mouth, nose, throat, and voice box. Then, a bronchoscope will be passed into your nose or mouth, and into your lungs.  After the procedure, your blood pressure, heart rate, breathing rate, and blood oxygen level will be monitored until you leave the hospital or clinic. You may have a chest X-ray to check for signs of pneumothorax.  You will not be allowed to eat or drink anything for 2 hours after your procedure. This information is not intended to replace advice given to you by your health care provider.  Make sure you discuss any questions you have with your health care provider. Document Revised: 03/28/2020 Document Reviewed: 03/28/2020 Elsevier Patient Education  [place]. Flexible Bronchoscopy, Care After This sheet gives you information about how to care for yourself after your test. Your doctor may also give you more specific instructions. If you have problems or questions, contact your doctor. Follow these instructions at home: Eating and drinking  Do not eat or drink anything (not even water) for 2 hours after your test, or until your numbing medicine (local anesthetic) wears off.  When your numbness is gone and your cough and gag reflexes have come back, you may: ? Eat only soft foods. ? Slowly drink liquids.  The day after the test, go back to your normal diet. Driving  Do not drive  for 24 hours if you were given a medicine to help you relax (sedative).  Do not drive or use heavy machinery while taking prescription pain medicine. General instructions   Take over-the-counter and prescription medicines only as told by your doctor.  Return to your normal activities as told. Ask what activities are safe for you.  Do not use any products that have nicotine or tobacco in them. This includes cigarettes and e-cigarettes. If you need help quitting, ask your doctor.  Keep all follow-up visits as told by your doctor. This is important. It is very important if you had a tissue sample (biopsy) taken. Get help right away if:  You have shortness of breath that gets worse.  You get light-headed.  You feel like you are going to pass out (faint).  You have chest pain.  You cough up: ? More than a little blood. ? More blood than before. Summary  Do not eat or drink anything (not even water) for 2 hours after your test, or until your numbing medicine wears off.  Do not use cigarettes. Do not use e-cigarettes.  Get help right away if you have chest pain.  Please call our office for any questions or concerns.  [phone].  This information is not intended to replace advice given to you by your health care provider. Make sure you discuss any questions you have with your health care provider. Document Released: 07/05/2009 Document Revised: 08/20/2017 Document Reviewed: 09/25/2016 Elsevier Patient Education  2020 [employer].

## 2020-11-06 LAB — CYTOLOGY - NON PAP

## 2020-11-07 ENCOUNTER — Encounter (HOSPITAL_COMMUNITY): Payer: Self-pay | Admitting: Emergency Medicine

## 2020-11-08 ENCOUNTER — Telehealth: Payer: Self-pay | Admitting: Emergency Medicine

## 2020-11-08 NOTE — Telephone Encounter (Signed)
Spoke with the patient.  Part of his bronchoscopy results are back, namely his EBUS samples from 7 node are negative for carcinoma.  His upper lobe brushings, biopsies are still pending.  I will call him back when these are available.

## 2020-11-12 LAB — CYTOLOGY - NON PAP

## 2020-11-13 ENCOUNTER — Encounter: Payer: Self-pay | Admitting: Emergency Medicine

## 2020-11-13 ENCOUNTER — Ambulatory Visit (INDEPENDENT_AMBULATORY_CARE_PROVIDER_SITE_OTHER): Payer: No Typology Code available for payment source | Admitting: Emergency Medicine

## 2020-11-13 ENCOUNTER — Other Ambulatory Visit: Payer: Self-pay

## 2020-11-13 DIAGNOSIS — C3491 Malignant neoplasm of unspecified part of right bronchus or lung: Secondary | ICD-10-CM | POA: Diagnosis not present

## 2020-11-13 NOTE — Progress Notes (Signed)
   Subjective:    Patient ID: Neil Riddle, male    DOB: 01-23-1951, 70 y.o.   MRN: [id number]  HPI 70 year old former smoker with a history of COPD, diabetes, hypertension, GERD, hyperlipidemia, depression, stage IIIb non-small cell lung cancer (adenocarcinoma) treated with concurrent chemoradiation, then durvalumab from 09/09/2018 through 11/10/2018, held due to complication of pneumonitis. He had A fib / Flutter after his initial SBRT.  Followed at the [facility] and oncology.  He has been undergoing serial CT scans, underwent chest abdomen pelvis 09/30/2020.  I have the records available, do not have the images yet.  This showed increased consolidative and groundglass right upper lobe and apical opacity compared with 06/26/2020, slight increase in size of a 7 mm right upper lobe groundglass nodule, interval increase in size of the right subcarinal paraesophageal lymph node.  No evidence of any other distant disease.  Occasional SOB w exertion, but quite active. Rare albuterol use.   Biopsy of suspicious hypermetabolic lymph node 05/6294 by repeat EBUS, nondiagnostic  ROV 11/13/20 --70 year old Neil Riddle with COPD and history of stage IIIb non-small cell lung cancer (adeno) who underwent chemoradiation and then durvalumab (caused pneumonitis).  He had an enlarging area of groundglass in his right upper lobe on serial CT scan as well as an enlarging station 7 node.  We performed bronchoscopy on 11/05/2020 and he returns to discuss the results.  The brushings and biopsies of his left upper lobe groundglass showed atypical cells but were not definitive for malignancy.  BAL was negative.  Station 7 needle biopsies did not show any malignancy.  He is planning to have a repeat CT chest in April with Oncology at the [facility] (through [facility]).     Review of Systems As per HPI  PMH: COPD Diabetes Hypertension GERD Hyperlipidemia Depression Stage IIIb adenocarcinoma of the lung A Fib / Flutter OSA not on therapy   TIA      Objective:   Physical Exam Vitals:   11/13/20 1423  BP: 118/80  Pulse: 74  Temp: 97.9 F (36.6 C)  TempSrc: Temporal  SpO2: 96%  Weight: 181 lb 3.2 oz (82.2 kg)  Height: 6' (1.829 m)   Gen: Pleasant, well-nourished, in no distress,  normal affect  ENT: No lesions,  mouth clear,  oropharynx clear, no postnasal drip  Neck: No JVD, no stridor  Lungs: No use of accessory muscles, no crackles or wheezing on normal respiration, no wheeze on forced expiration  Cardiovascular: RRR, heart sounds normal, no murmur or gallops, no peripheral edema  Musculoskeletal: No deformities, no cyanosis or clubbing  Neuro: alert, awake, non focal  Skin: Warm, no lesions or rash      Assessment & Plan:  Adenocarcinoma, lung, right (HCC) Your bronchoscopy results showed no evidence for cancer cells in your subcarinal lymph node.  The samples of your right upper lobe showed "atypical cells" but no overt cancer cells. Agree with plan for reimaging in April to assess for any interval change.  If present then it may be necessary to repeat your bronchoscopy in order to sample 1 or both of these areas. Please have oncology refer you back to see Dr. Lamonte Sakai to arrange such a procedure if necessary if it cannot be done at the [facility].  Baltazar Apo, MD, PhD 11/13/2020, 2:47 PM [facility] [phone] or if no answer [phone]

## 2020-11-13 NOTE — Telephone Encounter (Signed)
Discussed with him in office today

## 2020-11-13 NOTE — Patient Instructions (Addendum)
Your bronchoscopy results showed no evidence for cancer cells in your subcarinal lymph node.  The samples of your right upper lobe showed "atypical cells" but no overt cancer cells. Agree with plan for reimaging in April to assess for any interval change.  If present then it may be necessary to repeat your bronchoscopy in order to sample 1 or both of these areas. Please have oncology refer you back to see Dr. Lamonte Sakai to arrange such a procedure if necessary if it cannot be done at the [facility].

## 2020-11-13 NOTE — Assessment & Plan Note (Signed)
Your bronchoscopy results showed no evidence for cancer cells in your subcarinal lymph node.  The samples of your right upper lobe showed "atypical cells" but no overt cancer cells. Agree with plan for reimaging in April to assess for any interval change.  If present then it may be necessary to repeat your bronchoscopy in order to sample 1 or both of these areas. Please have oncology refer you back to see Dr. Lamonte Sakai to arrange such a procedure if necessary if it cannot be done at the [facility].

## 2022-11-16 IMAGING — DX DG CHEST 1V PORT
1 series · 1 of 1 positions shown · non-contrast
Comparison: 04/01/2020

CLINICAL DATA: Status post bronchoscopy.  History of lung cancer.

EXAM:
PORTABLE CHEST 1 VIEW

[chest]
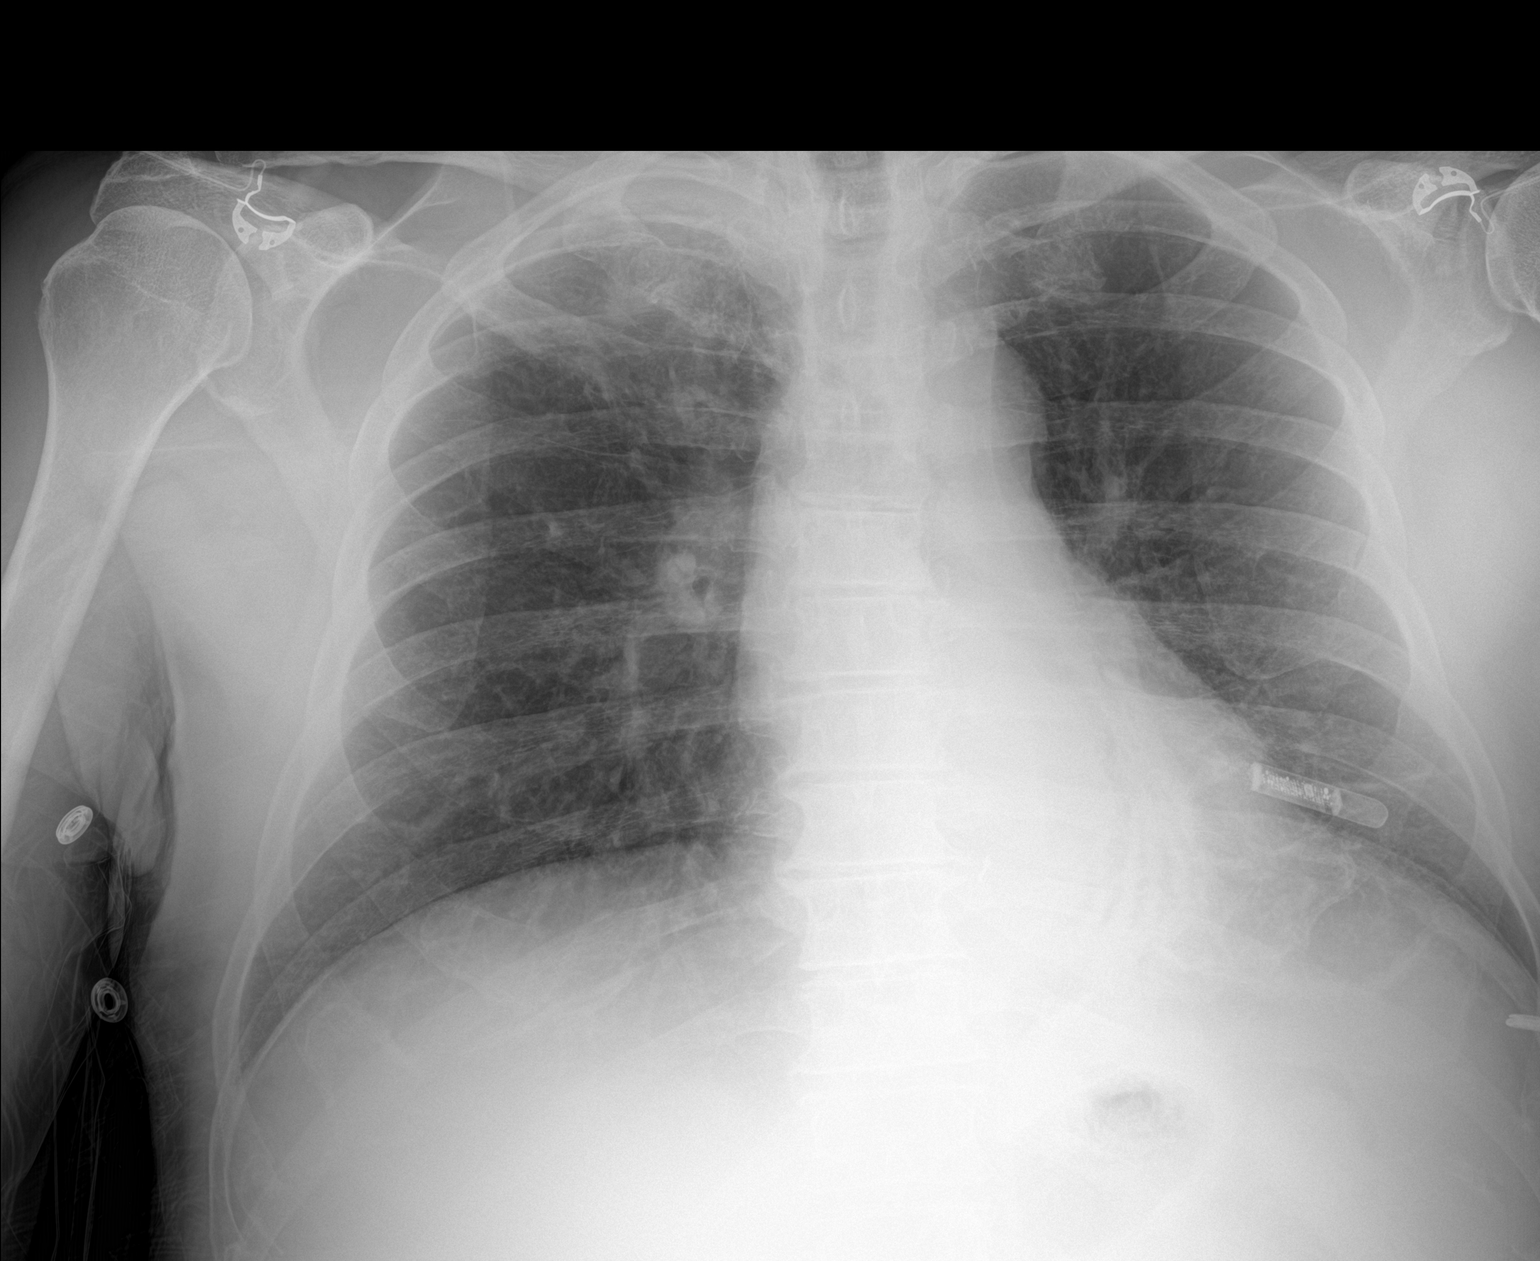

[1 of 1 positions shown; findings below may reference images not displayed]

FINDINGS: A loop recorder is identified in the projection of the left
ventricular apex. Persistent atelectasis and volume loss involving
the medial left lower lobe appears unchanged from 04/01/2020. Right
upper lobe mass with surrounding architectural distortion is again
noted. No pneumothorax identified status post bronchoscopy.
IMPRESSION: 1. No pneumothorax after bronchoscopy.
2. Stable right upper lobe mass with surrounding architectural
distortion.
3. Unchanged atelectasis and volume loss involving the medial left
lower lobe.
